# Patient Record
Sex: Male | Born: 1975
Health system: Southern US, Community
[De-identification: ages and names within clinical notes are randomized; demographics above are authoritative.]

## PROBLEM LIST (undated history)

## (undated) HISTORY — PX: OTHER SURGICAL HISTORY: SHX169

## (undated) HISTORY — PX: VASECTOMY: SHX75

---

## 2012-12-19 ENCOUNTER — Inpatient Hospital Stay (HOSPITAL_COMMUNITY)
Admission: EM | Admit: 2012-12-19 | Discharge: 2012-12-23 | DRG: 219 | Disposition: A | Discharge status: Home / Self Care | Payer: BC Managed Care – PPO | Attending: Emergency Medicine | Admitting: Emergency Medicine

## 2012-12-19 ENCOUNTER — Inpatient Hospital Stay (HOSPITAL_COMMUNITY): Payer: BC Managed Care – PPO

## 2012-12-19 ENCOUNTER — Emergency Department (HOSPITAL_COMMUNITY): Payer: BC Managed Care – PPO

## 2012-12-19 ENCOUNTER — Encounter (HOSPITAL_COMMUNITY): Payer: Self-pay | Admitting: Anesthesiology

## 2012-12-19 ENCOUNTER — Encounter (HOSPITAL_COMMUNITY): Admission: EM | Disposition: A | Discharge status: Home / Self Care | Payer: Self-pay | Source: Home / Self Care

## 2012-12-19 ENCOUNTER — Emergency Department (HOSPITAL_COMMUNITY): Payer: BC Managed Care – PPO | Admitting: Anesthesiology

## 2012-12-19 ENCOUNTER — Encounter (HOSPITAL_COMMUNITY): Payer: Self-pay

## 2012-12-19 DIAGNOSIS — M542 Cervicalgia: Secondary | ICD-10-CM | POA: Diagnosis present

## 2012-12-19 DIAGNOSIS — M25529 Pain in unspecified elbow: Secondary | ICD-10-CM | POA: Diagnosis present

## 2012-12-19 DIAGNOSIS — R109 Unspecified abdominal pain: Secondary | ICD-10-CM

## 2012-12-19 DIAGNOSIS — M25519 Pain in unspecified shoulder: Secondary | ICD-10-CM | POA: Diagnosis present

## 2012-12-19 DIAGNOSIS — Y92410 Unspecified street and highway as the place of occurrence of the external cause: Secondary | ICD-10-CM

## 2012-12-19 DIAGNOSIS — S8253XB Displaced fracture of medial malleolus of unspecified tibia, initial encounter for open fracture type I or II: Secondary | ICD-10-CM

## 2012-12-19 DIAGNOSIS — S82209B Unspecified fracture of shaft of unspecified tibia, initial encounter for open fracture type I or II: Principal | ICD-10-CM | POA: Diagnosis present

## 2012-12-19 DIAGNOSIS — T07XXXA Unspecified multiple injuries, initial encounter: Secondary | ICD-10-CM | POA: Diagnosis present

## 2012-12-19 DIAGNOSIS — S82202C Unspecified fracture of shaft of left tibia, initial encounter for open fracture type IIIA, IIIB, or IIIC: Secondary | ICD-10-CM

## 2012-12-19 DIAGNOSIS — K5900 Constipation, unspecified: Secondary | ICD-10-CM | POA: Diagnosis not present

## 2012-12-19 DIAGNOSIS — Y998 Other external cause status: Secondary | ICD-10-CM

## 2012-12-19 DIAGNOSIS — IMO0002 Reserved for concepts with insufficient information to code with codable children: Secondary | ICD-10-CM | POA: Diagnosis present

## 2012-12-19 HISTORY — PX: TIBIA IM NAIL INSERTION: SHX2516

## 2012-12-19 LAB — COMPREHENSIVE METABOLIC PANEL
ALT: 18 U/L (ref 0–53)
AST: 21 U/L (ref 0–37)
Albumin: 4.2 g/dL (ref 3.5–5.2)
Alkaline Phosphatase: 42 U/L (ref 39–117)
CO2: 24 mEq/L (ref 19–32)
Chloride: 100 mEq/L (ref 96–112)
GFR calc non Af Amer: 70 mL/min — ABNORMAL LOW (ref >90)
Potassium: 3.5 mEq/L (ref 3.5–5.1)
Sodium: 139 mEq/L (ref 135–145)
Total Bilirubin: 0.5 mg/dL (ref 0.3–1.2)

## 2012-12-19 LAB — POCT I-STAT, CHEM 8
BUN: 15 mg/dL (ref 6–23)
Creatinine, Ser: 1.5 mg/dL — ABNORMAL HIGH (ref 0.50–1.35)
Glucose, Bld: 143 mg/dL — ABNORMAL HIGH (ref 70–99)
Hemoglobin: 15.6 g/dL (ref 13.0–17.0)
Potassium: 3.6 mEq/L (ref 3.5–5.1)
Sodium: 141 mEq/L (ref 135–145)

## 2012-12-19 LAB — CBC
MCH: 29.9 pg (ref 26.0–34.0)
MCHC: 35 g/dL (ref 30.0–36.0)
MCV: 85.4 fL (ref 78.0–100.0)
Platelets: 247 10*3/uL (ref 150–400)

## 2012-12-19 LAB — CDS SEROLOGY

## 2012-12-19 LAB — PROTIME-INR: Prothrombin Time: 12.7 seconds (ref 11.6–15.2)
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
## 2012-12-19 SURGERY — INSERTION, INTRAMEDULLARY ROD, TIBIA
Anesthesia: General | Site: Leg Lower | Laterality: Left | Wound class: Dirty or Infected

## 2012-12-19 MED ORDER — MORPHINE SULFATE 2 MG/ML IJ SOLN
1.0000 mg | INTRAMUSCULAR | Status: DC | PRN
  Administered 2012-12-19: 1 mg via INTRAVENOUS
  Filled 2012-12-19: qty 1

## 2012-12-19 MED ORDER — SODIUM CHLORIDE 0.9 % IV SOLN: INTRAVENOUS | Status: DC

## 2012-12-19 MED ORDER — POLYETHYLENE GLYCOL 3350 17 G PO PACK
17.0000 g | PACK | Freq: Every day | ORAL | Status: DC | PRN
  Administered 2012-12-21 – 2012-12-23 (×2): 17 g via ORAL
  Filled 2012-12-19 (×3): qty 1

## 2012-12-19 MED ORDER — IOHEXOL 300 MG/ML  SOLN
100.0000 mL | Freq: Once | INTRAMUSCULAR | Status: AC | PRN
  Administered 2012-12-19: 100 mL via INTRAVENOUS

## 2012-12-19 MED ORDER — ONDANSETRON HCL 4 MG/2ML IJ SOLN: 4.0000 mg | Freq: Four times a day (QID) | INTRAMUSCULAR | Status: DC | PRN

## 2012-12-19 MED ORDER — LACTATED RINGERS IV SOLN
INTRAVENOUS | Status: DC | PRN
  Administered 2012-12-19 (×2): via INTRAVENOUS

## 2012-12-19 MED ORDER — CEFAZOLIN SODIUM 1-5 GM-% IV SOLN
1.0000 g | Freq: Once | INTRAVENOUS | Status: AC
  Administered 2012-12-19: 1 g via INTRAVENOUS
  Filled 2012-12-19: qty 50

## 2012-12-19 MED ORDER — HYDROCODONE-ACETAMINOPHEN 5-325 MG PO TABS
1.0000 | ORAL_TABLET | ORAL | Status: DC | PRN
  Administered 2012-12-19 (×2): 1 via ORAL
  Administered 2012-12-20: 2 via ORAL
  Administered 2012-12-20: 1 via ORAL
  Administered 2012-12-21 – 2012-12-22 (×4): 2 via ORAL
  Administered 2012-12-22: 1 via ORAL
  Administered 2012-12-22 – 2012-12-23 (×5): 2 via ORAL
  Filled 2012-12-19 (×2): qty 2
  Filled 2012-12-19: qty 1
  Filled 2012-12-19 (×7): qty 2
  Filled 2012-12-19: qty 1
  Filled 2012-12-19 (×4): qty 2

## 2012-12-19 MED ORDER — DIPHENHYDRAMINE HCL 12.5 MG/5ML PO ELIX: 25.0000 mg | ORAL_SOLUTION | ORAL | Status: DC | PRN

## 2012-12-19 MED ORDER — DIAZEPAM 5 MG PO TABS
5.0000 mg | ORAL_TABLET | Freq: Four times a day (QID) | ORAL | Status: DC | PRN
  Administered 2012-12-19 – 2012-12-23 (×8): 5 mg via ORAL
  Filled 2012-12-19 (×8): qty 1

## 2012-12-19 MED ORDER — OXYCODONE HCL 5 MG/5ML PO SOLN: 5.0000 mg | Freq: Once | ORAL | Status: AC | PRN

## 2012-12-19 MED ORDER — ONDANSETRON HCL 4 MG/2ML IJ SOLN: 4.0000 mg | Freq: Once | INTRAMUSCULAR | Status: DC | PRN

## 2012-12-19 MED ORDER — METOCLOPRAMIDE HCL 5 MG/ML IJ SOLN: 5.0000 mg | Freq: Three times a day (TID) | INTRAMUSCULAR | Status: DC | PRN

## 2012-12-19 MED ORDER — SODIUM CHLORIDE 0.9 % IR SOLN
Status: DC | PRN
  Administered 2012-12-19: 6000 mL

## 2012-12-19 MED ORDER — SORBITOL 70 % SOLN: 30.0000 mL | Freq: Every day | Status: DC | PRN

## 2012-12-19 MED ORDER — HYDROMORPHONE HCL PF 1 MG/ML IJ SOLN
1.0000 mg | Freq: Once | INTRAMUSCULAR | Status: AC
  Administered 2012-12-19: 1 mg via INTRAVENOUS

## 2012-12-19 MED ORDER — CEFAZOLIN SODIUM-DEXTROSE 2-3 GM-% IV SOLR
2.0000 g | Freq: Four times a day (QID) | INTRAVENOUS | Status: AC
  Administered 2012-12-19 – 2012-12-20 (×3): 2 g via INTRAVENOUS
  Filled 2012-12-19 (×3): qty 50

## 2012-12-19 MED ORDER — ONDANSETRON HCL 4 MG/2ML IJ SOLN
INTRAMUSCULAR | Status: DC | PRN
  Administered 2012-12-19: 4 mg via INTRAVENOUS

## 2012-12-19 MED ORDER — TETANUS-DIPHTHERIA TOXOIDS TD 5-2 LFU IM INJ
0.5000 mL | INJECTION | Freq: Once | INTRAMUSCULAR | Status: AC
  Administered 2012-12-19: 0.5 mL via INTRAMUSCULAR
  Filled 2012-12-19: qty 0.5

## 2012-12-19 MED ORDER — CEFAZOLIN SODIUM-DEXTROSE 2-3 GM-% IV SOLR
INTRAVENOUS | Status: DC | PRN
  Administered 2012-12-19: 2 g via INTRAVENOUS

## 2012-12-19 MED ORDER — LORAZEPAM 2 MG/ML IJ SOLN
1.0000 mg | Freq: Once | INTRAMUSCULAR | Status: AC
  Administered 2012-12-19: 1 mg via INTRAVENOUS
  Filled 2012-12-19: qty 1

## 2012-12-19 MED ORDER — HYDROMORPHONE HCL PF 1 MG/ML IJ SOLN
INTRAMUSCULAR | Status: AC
  Administered 2012-12-19: 1 mg via INTRAVENOUS
  Filled 2012-12-19: qty 1

## 2012-12-19 MED ORDER — ASPIRIN EC 325 MG PO TBEC
325.0000 mg | DELAYED_RELEASE_TABLET | Freq: Two times a day (BID) | ORAL | Status: DC
  Administered 2012-12-19 – 2012-12-23 (×8): 325 mg via ORAL
  Filled 2012-12-19 (×10): qty 1

## 2012-12-19 MED ORDER — ONDANSETRON HCL 4 MG PO TABS: 4.0000 mg | ORAL_TABLET | Freq: Four times a day (QID) | ORAL | Status: DC | PRN

## 2012-12-19 MED ORDER — ROCURONIUM BROMIDE 100 MG/10ML IV SOLN
INTRAVENOUS | Status: DC | PRN
  Administered 2012-12-19: 50 mg via INTRAVENOUS
  Administered 2012-12-19 (×2): 10 mg via INTRAVENOUS
  Administered 2012-12-19: 30 mg via INTRAVENOUS

## 2012-12-19 MED ORDER — WHITE PETROLATUM GEL
Status: AC
  Filled 2012-12-19: qty 5

## 2012-12-19 MED ORDER — SODIUM CHLORIDE 0.9 % IV SOLN
INTRAVENOUS | Status: DC
  Administered 2012-12-19: 09:00:00 via INTRAVENOUS
  Administered 2012-12-19: 125 mL/h via INTRAVENOUS

## 2012-12-19 MED ORDER — ARTIFICIAL TEARS OP OINT
TOPICAL_OINTMENT | OPHTHALMIC | Status: DC | PRN
  Administered 2012-12-19: 1 via OPHTHALMIC

## 2012-12-19 MED ORDER — CEFAZOLIN SODIUM-DEXTROSE 2-3 GM-% IV SOLR
INTRAVENOUS | Status: AC
  Filled 2012-12-19: qty 50

## 2012-12-19 MED ORDER — HYDROMORPHONE HCL PF 1 MG/ML IJ SOLN: 0.2500 mg | INTRAMUSCULAR | Status: DC | PRN

## 2012-12-19 MED ORDER — NEOSTIGMINE METHYLSULFATE 1 MG/ML IJ SOLN
INTRAMUSCULAR | Status: DC | PRN
  Administered 2012-12-19: 4 mg via INTRAVENOUS

## 2012-12-19 MED ORDER — ONDANSETRON HCL 4 MG/2ML IJ SOLN
4.0000 mg | Freq: Once | INTRAMUSCULAR | Status: AC
  Administered 2012-12-19: 4 mg via INTRAVENOUS
  Filled 2012-12-19: qty 2

## 2012-12-19 MED ORDER — OXYCODONE HCL 5 MG PO TABS
5.0000 mg | ORAL_TABLET | Freq: Once | ORAL | Status: AC | PRN
  Administered 2012-12-19: 5 mg via ORAL

## 2012-12-19 MED ORDER — HYDROMORPHONE HCL PF 1 MG/ML IJ SOLN
1.0000 mg | Freq: Once | INTRAMUSCULAR | Status: AC
  Administered 2012-12-19: 1 mg via INTRAVENOUS
  Filled 2012-12-19: qty 1

## 2012-12-19 MED ORDER — OXYCODONE HCL 5 MG PO TABS
ORAL_TABLET | ORAL | Status: AC
  Filled 2012-12-19: qty 1

## 2012-12-19 MED ORDER — MAGNESIUM CITRATE PO SOLN
1.0000 | Freq: Once | ORAL | Status: AC | PRN
  Filled 2012-12-19: qty 296

## 2012-12-19 MED ORDER — MORPHINE SULFATE 2 MG/ML IJ SOLN
2.0000 mg | INTRAMUSCULAR | Status: DC | PRN
  Administered 2012-12-19 – 2012-12-20 (×10): 2 mg via INTRAVENOUS
  Filled 2012-12-19 (×9): qty 1

## 2012-12-19 MED ORDER — GENTAMICIN IN SALINE 1.6-0.9 MG/ML-% IV SOLN
80.0000 mg | Freq: Once | INTRAVENOUS | Status: AC
  Administered 2012-12-19: 80 mg via INTRAVENOUS
  Filled 2012-12-19: qty 50

## 2012-12-19 MED ORDER — VECURONIUM BROMIDE 10 MG IV SOLR
INTRAVENOUS | Status: DC | PRN
  Administered 2012-12-19: 2 mg via INTRAVENOUS

## 2012-12-19 MED ORDER — HYDROMORPHONE HCL PF 1 MG/ML IJ SOLN
INTRAMUSCULAR | Status: AC
  Filled 2012-12-19: qty 1

## 2012-12-19 MED ORDER — HYDROMORPHONE HCL PF 1 MG/ML IJ SOLN: 1.0000 mg | Freq: Once | INTRAMUSCULAR | Status: DC

## 2012-12-19 MED ORDER — MIDAZOLAM HCL 5 MG/5ML IJ SOLN
INTRAMUSCULAR | Status: DC | PRN
  Administered 2012-12-19: 2 mg via INTRAVENOUS

## 2012-12-19 MED ORDER — GLYCOPYRROLATE 0.2 MG/ML IJ SOLN
INTRAMUSCULAR | Status: DC | PRN
  Administered 2012-12-19: 0.6 mg via INTRAVENOUS

## 2012-12-19 MED ORDER — PROPOFOL 10 MG/ML IV BOLUS
INTRAVENOUS | Status: DC | PRN
  Administered 2012-12-19: 200 mg via INTRAVENOUS

## 2012-12-19 MED ORDER — FENTANYL CITRATE 0.05 MG/ML IJ SOLN
INTRAMUSCULAR | Status: AC
  Filled 2012-12-19: qty 2

## 2012-12-19 MED ORDER — SODIUM CHLORIDE 0.9 % IV BOLUS (SEPSIS): 1000.0000 mL | Freq: Once | INTRAVENOUS | Status: DC

## 2012-12-19 MED ORDER — MORPHINE SULFATE 2 MG/ML IJ SOLN
INTRAMUSCULAR | Status: AC
  Filled 2012-12-19: qty 1

## 2012-12-19 MED ORDER — LACTATED RINGERS IV SOLN
INTRAVENOUS | Status: DC | PRN
  Administered 2012-12-19 (×2): via INTRAVENOUS

## 2012-12-19 MED ORDER — HYDROMORPHONE HCL PF 1 MG/ML IJ SOLN
0.2500 mg | INTRAMUSCULAR | Status: DC | PRN
  Administered 2012-12-19 (×4): 0.5 mg via INTRAVENOUS

## 2012-12-19 MED ORDER — FENTANYL CITRATE 0.05 MG/ML IJ SOLN
50.0000 ug | INTRAMUSCULAR | Status: DC | PRN
  Administered 2012-12-19: 100 ug via INTRAVENOUS

## 2012-12-19 MED ORDER — LIDOCAINE HCL (CARDIAC) 20 MG/ML IV SOLN
INTRAVENOUS | Status: DC | PRN
  Administered 2012-12-19: 100 mg via INTRAVENOUS

## 2012-12-19 MED ORDER — SENNA 8.6 MG PO TABS
1.0000 | ORAL_TABLET | Freq: Two times a day (BID) | ORAL | Status: DC
  Administered 2012-12-19 – 2012-12-23 (×8): 8.6 mg via ORAL
  Filled 2012-12-19 (×9): qty 1

## 2012-12-19 MED ORDER — FENTANYL CITRATE 0.05 MG/ML IJ SOLN
50.0000 ug | INTRAMUSCULAR | Status: DC | PRN
  Administered 2012-12-19: 50 ug via INTRAVENOUS
  Filled 2012-12-19: qty 2

## 2012-12-19 MED ORDER — FENTANYL CITRATE 0.05 MG/ML IJ SOLN
INTRAMUSCULAR | Status: DC | PRN
  Administered 2012-12-19 (×3): 50 ug via INTRAVENOUS
  Administered 2012-12-19: 100 ug via INTRAVENOUS

## 2012-12-19 MED ORDER — OXYCODONE HCL 5 MG PO TABS
5.0000 mg | ORAL_TABLET | ORAL | Status: DC | PRN
  Administered 2012-12-20 (×5): 10 mg via ORAL
  Administered 2012-12-21: 5 mg via ORAL
  Administered 2012-12-21 (×3): 10 mg via ORAL
  Administered 2012-12-21: 5 mg via ORAL
  Administered 2012-12-21 – 2012-12-22 (×4): 10 mg via ORAL
  Administered 2012-12-22: 5 mg via ORAL
  Administered 2012-12-22 – 2012-12-23 (×8): 10 mg via ORAL
  Filled 2012-12-19: qty 2
  Filled 2012-12-19: qty 1
  Filled 2012-12-19 (×19): qty 2
  Filled 2012-12-19: qty 1
  Filled 2012-12-19: qty 2

## 2012-12-19 MED ORDER — METOCLOPRAMIDE HCL 10 MG PO TABS: 5.0000 mg | ORAL_TABLET | Freq: Three times a day (TID) | ORAL | Status: DC | PRN

## 2012-12-19 SURGICAL SUPPLY — 68 items
BANDAGE ELASTIC 4 VELCRO ST LF (GAUZE/BANDAGES/DRESSINGS) ×2 IMPLANT
BANDAGE ELASTIC 6 VELCRO ST LF (GAUZE/BANDAGES/DRESSINGS) ×2 IMPLANT
BANDAGE GAUZE ELAST BULKY 4 IN (GAUZE/BANDAGES/DRESSINGS) ×2 IMPLANT
BIT DRILL LONG 4.0 (BIT) ×1 IMPLANT
BIT DRILL SHORT 4.0 (BIT) ×2 IMPLANT
BLADE SURG 10 STRL SS (BLADE) ×2 IMPLANT
BNDG COHESIVE 4X5 TAN STRL (GAUZE/BANDAGES/DRESSINGS) ×2 IMPLANT
BNDG COHESIVE 6X5 TAN STRL LF (GAUZE/BANDAGES/DRESSINGS) ×2 IMPLANT
CLOTH BEACON ORANGE TIMEOUT ST (SAFETY) ×2 IMPLANT
COVER MAYO STAND STRL (DRAPES) ×2 IMPLANT
COVER SURGICAL LIGHT HANDLE (MISCELLANEOUS) ×4 IMPLANT
CUFF TOURNIQUET SINGLE 34IN LL (TOURNIQUET CUFF) IMPLANT
CUFF TOURNIQUET SINGLE 44IN (TOURNIQUET CUFF) IMPLANT
DRAPE C-ARM 42X72 X-RAY (DRAPES) ×2 IMPLANT
DRAPE C-ARMOR (DRAPES) ×2 IMPLANT
DRAPE INCISE IOBAN 66X45 STRL (DRAPES) IMPLANT
DRAPE ORTHO SPLIT 77X108 STRL (DRAPES) ×1
DRAPE PROXIMA HALF (DRAPES) ×2 IMPLANT
DRAPE SURG ORHT 6 SPLT 77X108 (DRAPES) ×1 IMPLANT
DRESSING OPSITE X SMALL 2X3 (GAUZE/BANDAGES/DRESSINGS) ×2 IMPLANT
DRILL BIT LONG 4.0 (BIT) ×2
DRILL BIT SHORT 4.0 (BIT) ×2
DURAPREP 26ML APPLICATOR (WOUND CARE) ×2 IMPLANT
ELECT REM PT RETURN 9FT ADLT (ELECTROSURGICAL) ×2
ELECTRODE REM PT RTRN 9FT ADLT (ELECTROSURGICAL) ×1 IMPLANT
GAUZE XEROFORM 1X8 LF (GAUZE/BANDAGES/DRESSINGS) ×2 IMPLANT
GAUZE XEROFORM 5X9 LF (GAUZE/BANDAGES/DRESSINGS) ×4 IMPLANT
GLOVE BIO SURGEON STRL SZ8 (GLOVE) ×2 IMPLANT
GLOVE BIOGEL PI IND STRL 8 (GLOVE) ×2 IMPLANT
GLOVE BIOGEL PI INDICATOR 8 (GLOVE) ×2
GLOVE ORTHO TXT STRL SZ7.5 (GLOVE) ×2 IMPLANT
GOWN PREVENTION PLUS LG XLONG (DISPOSABLE) IMPLANT
GOWN PREVENTION PLUS XLARGE (GOWN DISPOSABLE) ×4 IMPLANT
GOWN STRL NON-REIN LRG LVL3 (GOWN DISPOSABLE) ×2 IMPLANT
GUIDE PIN 3.2MM (MISCELLANEOUS) ×1
GUIDE PIN ORTH 343X3.2XBRAD (MISCELLANEOUS) ×1 IMPLANT
GUIDE ROD 3.0 (MISCELLANEOUS) ×2
KIT BASIN OR (CUSTOM PROCEDURE TRAY) ×2 IMPLANT
KIT ROOM TURNOVER OR (KITS) ×2 IMPLANT
MANIFOLD NEPTUNE II (INSTRUMENTS) ×2 IMPLANT
NAIL TIBIAL10X35 (Nail) ×2 IMPLANT
NEEDLE HYPO 21X1.5 SAFETY (NEEDLE) ×2 IMPLANT
NS IRRIG 1000ML POUR BTL (IV SOLUTION) ×2 IMPLANT
PACK ORTHO EXTREMITY (CUSTOM PROCEDURE TRAY) ×2 IMPLANT
PAD ARMBOARD 7.5X6 YLW CONV (MISCELLANEOUS) ×4 IMPLANT
PAD CAST 4YDX4 CTTN HI CHSV (CAST SUPPLIES) ×1 IMPLANT
PADDING CAST COTTON 4X4 STRL (CAST SUPPLIES) ×1
ROD GUIDE 3.0 (MISCELLANEOUS) ×1 IMPLANT
SCREW TRIGEN LOW PROF 5.0X30 (Screw) ×2 IMPLANT
SCREW TRIGEN LOW PROF 5.0X37.5 (Screw) ×4 IMPLANT
SCREW TRIGEN LOW PROF 5.0X45 (Screw) ×2 IMPLANT
SPONGE GAUZE 4X4 12PLY (GAUZE/BANDAGES/DRESSINGS) ×2 IMPLANT
SPONGE LAP 18X18 X RAY DECT (DISPOSABLE) ×2 IMPLANT
SPONGE LAP 4X18 X RAY DECT (DISPOSABLE) ×2 IMPLANT
STAPLER VISISTAT 35W (STAPLE) ×2 IMPLANT
STOCKINETTE IMPERVIOUS LG (DRAPES) ×2 IMPLANT
SUT ETHILON 2 0 FS 18 (SUTURE) ×2 IMPLANT
SUT VIC AB 0 CT1 27 (SUTURE) ×1
SUT VIC AB 0 CT1 27XBRD ANBCTR (SUTURE) ×1 IMPLANT
SUT VIC AB 0 CTB1 27 (SUTURE) ×2 IMPLANT
SUT VIC AB 2-0 CT1 27 (SUTURE) ×1
SUT VIC AB 2-0 CT1 TAPERPNT 27 (SUTURE) ×1 IMPLANT
SYR CONTROL 10ML LL (SYRINGE) ×2 IMPLANT
TOWEL OR 17X24 6PK STRL BLUE (TOWEL DISPOSABLE) ×2 IMPLANT
TOWEL OR 17X26 10 PK STRL BLUE (TOWEL DISPOSABLE) ×2 IMPLANT
TUBE CONNECTING 12X1/4 (SUCTIONS) ×2 IMPLANT
WATER STERILE IRR 1000ML POUR (IV SOLUTION) ×2 IMPLANT
YANKAUER SUCT BULB TIP NO VENT (SUCTIONS) ×2 IMPLANT

## 2012-12-19 NOTE — Anesthesia Preprocedure Evaluation (Addendum)
Anesthesia Evaluation  Patient identified by MRN, date of birth, ID band Patient awake    Reviewed: Allergy & Precautions, H&P , NPO status , Patient's Chart, lab work & pertinent test results  Airway Mallampati: II  Neck ROM: full    Dental   Pulmonary neg pulmonary ROS,          Cardiovascular negative cardio ROS      Neuro/Psych negative neurological ROS     GI/Hepatic   Endo/Other    Renal/GU      Musculoskeletal   Abdominal   Peds  Hematology   Anesthesia Other Findings   Reproductive/Obstetrics                           Anesthesia Physical Anesthesia Plan  ASA: I  Anesthesia Plan: General   Post-op Pain Management:    Induction: Intravenous  Airway Management Planned: Oral ETT  Additional Equipment:   Intra-op Plan:   Post-operative Plan: Extubation in OR  Informed Consent: I have reviewed the patients History and Physical, chart, labs and discussed the procedure including the risks, benefits and alternatives for the proposed anesthesia with the patient or authorized representative who has indicated his/her understanding and acceptance.     Plan Discussed with: CRNA, Anesthesiologist and Surgeon  Anesthesia Plan Comments:         Anesthesia Quick Evaluation
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
## 2012-12-19 NOTE — ED Notes (Signed)
Lactic acid and chem 8 results called to primary nurse in CT

## 2012-12-19 NOTE — Preoperative (Signed)
Beta Blockers   Reason not to administer Beta Blockers:Not Applicable 

## 2012-12-19 NOTE — Brief Op Note (Signed)
Brief Op Note  Date of Surgery: 12/19/2012  Preoperative Diagnosis: Left open tibia fracture  Postoperative Diagnosis: same  Procedure: Procedure(s): INTRAMEDULLARY (IM) NAIL TIBIAL Left  Implants: S&N tibial nail  Surgeons: Surgeon(s): Naiping Glee Arvin, MD Kathryne Hitch, MD  Anesthesia: General  Drains: none  Estimated Blood Loss: * No blood loss amount entered *  Complications: None  Condition to PACU: Stable  Naiping Glee Arvin, MD [facility] 12/19/2012 2:26 PM
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
## 2012-12-19 NOTE — ED Notes (Signed)
Pt remains in Radiology. Family updated on pt's status.

## 2012-12-19 NOTE — Transfer of Care (Signed)
Immediate Anesthesia Transfer of Care Note  Patient: Benjamin Morgan  Procedure(s) Performed: Procedure(s): INTRAMEDULLARY (IM) NAIL TIBIAL Left (Left)  Patient Location: PACU  Anesthesia Type:General  Level of Consciousness: awake, alert , oriented and sedated  Airway & Oxygen Therapy: Patient Spontanous Breathing and Patient connected to nasal cannula oxygen  Post-op Assessment: Report given to PACU RN, Post -op Vital signs reviewed and stable and Patient moving all extremities  Post vital signs: Reviewed and stable  Complications: No apparent anesthesia complications
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
## 2012-12-19 NOTE — Anesthesia Postprocedure Evaluation (Signed)
  Anesthesia Post-op Note  Patient: Benjamin Morgan  Procedure(s) Performed: Procedure(s): INTRAMEDULLARY (IM) NAIL TIBIAL Left (Left)  Patient Location: PACU  Anesthesia Type:General  Level of Consciousness: awake  Airway and Oxygen Therapy: Patient Spontanous Breathing  Post-op Pain: mild  Post-op Assessment: Post-op Vital signs reviewed  Post-op Vital Signs: stable  Complications: No apparent anesthesia complications

## 2012-12-19 NOTE — ED Notes (Signed)
Dr Wyatt at bedside.  

## 2012-12-19 NOTE — Progress Notes (Signed)
Orthopedic Tech Progress Note Patient Details:  Benjamin Morgan [birth year] [id number]  Ortho Devices Type of Ortho Device: Post (short leg) splint;Stirrup splint Ortho Device/Splint Interventions: Application   Cammer, Mickie Bail 12/19/2012, 11:04 AM
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
## 2012-12-19 NOTE — ED Notes (Signed)
Patient transported to CT 
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
## 2012-12-20 ENCOUNTER — Encounter (HOSPITAL_COMMUNITY): Payer: Self-pay | Admitting: Emergency Medicine

## 2012-12-20 ENCOUNTER — Inpatient Hospital Stay (HOSPITAL_COMMUNITY): Payer: BC Managed Care – PPO

## 2012-12-20 LAB — COMPREHENSIVE METABOLIC PANEL
ALT: 15 U/L (ref 0–53)
AST: 33 U/L (ref 0–37)
Albumin: 3.4 g/dL — ABNORMAL LOW (ref 3.5–5.2)
Alkaline Phosphatase: 38 U/L — ABNORMAL LOW (ref 39–117)
Calcium: 8.7 mg/dL (ref 8.4–10.5)
GFR calc Af Amer: 85 mL/min — ABNORMAL LOW (ref >90)
Potassium: 3.8 mEq/L (ref 3.5–5.1)
Sodium: 138 mEq/L (ref 135–145)
Total Protein: 6.2 g/dL (ref 6.0–8.3)

## 2012-12-20 MED ORDER — PREGABALIN 50 MG PO CAPS
75.0000 mg | ORAL_CAPSULE | Freq: Two times a day (BID) | ORAL | Status: DC
  Administered 2012-12-20 – 2012-12-23 (×7): 75 mg via ORAL
  Filled 2012-12-20 (×7): qty 1

## 2012-12-20 MED ORDER — ACETAMINOPHEN 500 MG PO TABS: 500.0000 mg | ORAL_TABLET | Freq: Four times a day (QID) | ORAL | Status: DC | PRN

## 2012-12-20 NOTE — Progress Notes (Signed)
Subjective:  Patient reports pain as moderate to severe.  No events  Objective:   VITALS:   Filed Vitals:   12/19/12 1844 12/19/12 2105 12/20/12 0206 12/20/12 0503  BP: 134/83 135/82 133/79 130/71  Pulse: 90 83 69 69  Temp: 98.8 F (37.1 C) 98.9 F (37.2 C) 98.8 F (37.1 C) 98.8 F (37.1 C)  TempSrc: Oral     Resp: 16 16 16 16   Height:      Weight:      SpO2: 100% 99% 99% 100%    Neurologically intact Neurovascular intact Sensation intact distally Intact pulses distally Incision: scant drainage No cellulitis present Compartment soft   Lab Results  Component Value Date   WBC 11.5* 12/19/2012   HGB 15.6 12/19/2012   HCT 46.0 12/19/2012   MCV 85.4 12/19/2012   PLT 247 12/19/2012     Assessment/Plan: 1 Day Post-Op   Problem List Items Addressed This Visit   None      Advance diet Up with therapy Dc foley this am DVT ppx - asa bid Xray of left shoulder ordered - shoulder pain likely 2/2 AC sprain Pain control Discharge planning   Cheral Almas 12/20/2012, 8:10 AM [phone]

## 2012-12-20 NOTE — Progress Notes (Signed)
Pt only able to void 50cc after foley D/Cd at 0700 this am. Bladder scan showed 924 cc urine. I/O cath done at this time. 1100 cc urine obtained. Will continue to monitor pts output.

## 2012-12-20 NOTE — Progress Notes (Signed)
Rehab Admissions Coordinator Note:  Patient was screened by Trish Mage for appropriateness for an Inpatient Acute Rehab Consult.  Noted PT/OT recommending CIR.  At this time, we are recommending Inpatient Rehab consult.  Trish Mage 12/20/2012, 4:30 PM  I can be reached at [phone].
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
## 2012-12-20 NOTE — Progress Notes (Signed)
UR COMPLETED  
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
## 2012-12-20 NOTE — Progress Notes (Signed)
Pt very hesitant to move or be moved at all in bed,tenses up,prns given w relief for 2hrs,family at bedside hand feeding pt.Enc to maintain some independent adl's and move unaffected extremities.c/o r shoulder pain upper frontal area right side.3+ bil radial/brachial pulses.Linward Headland D

## 2012-12-20 NOTE — Progress Notes (Signed)
Orthopedic Tech Progress Note Patient Details:  Benjamin Morgan 07-28-1975 [id number]  Ortho Devices Type of Ortho Device: Post (short leg) splint;Stirrup splint Ortho Device/Splint Location: applied overhead frame to bed Ortho Device/Splint Interventions: Ordered;Application   [name] 12/20/2012, 8:16 PM

## 2012-12-21 ENCOUNTER — Encounter (HOSPITAL_COMMUNITY): Payer: Self-pay | Admitting: Orthopaedic Surgery

## 2012-12-21 DIAGNOSIS — M7530 Calcific tendinitis of unspecified shoulder: Secondary | ICD-10-CM

## 2012-12-21 DIAGNOSIS — S82409A Unspecified fracture of shaft of unspecified fibula, initial encounter for closed fracture: Secondary | ICD-10-CM

## 2012-12-21 DIAGNOSIS — S82209A Unspecified fracture of shaft of unspecified tibia, initial encounter for closed fracture: Secondary | ICD-10-CM

## 2012-12-21 MED ORDER — DIAZEPAM 5 MG PO TABS: 5.0000 mg | ORAL_TABLET | Freq: Four times a day (QID) | ORAL | Status: DC | PRN

## 2012-12-21 MED ORDER — ASPIRIN 325 MG PO TBEC: 325.0000 mg | DELAYED_RELEASE_TABLET | Freq: Two times a day (BID) | ORAL | Status: DC

## 2012-12-21 MED ORDER — SENNA 8.6 MG PO TABS: 1.0000 | ORAL_TABLET | Freq: Two times a day (BID) | ORAL | Status: DC

## 2012-12-21 MED ORDER — ONDANSETRON HCL 4 MG PO TABS: 4.0000 mg | ORAL_TABLET | Freq: Four times a day (QID) | ORAL | Status: DC | PRN

## 2012-12-21 MED ORDER — PREGABALIN 75 MG PO CAPS: 75.0000 mg | ORAL_CAPSULE | Freq: Two times a day (BID) | ORAL | Status: DC

## 2012-12-21 MED ORDER — OXYCODONE HCL 5 MG PO TABS: 5.0000 mg | ORAL_TABLET | ORAL | Status: DC | PRN
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
## 2012-12-21 NOTE — Progress Notes (Signed)
Looks like pt has progressed well with therapy as to go home as he prefers. I will follow up on Monday. [phone]

## 2012-12-21 NOTE — Progress Notes (Signed)
Subjective:  Patient reports pain as moderate to severe.  No events  Objective:   VITALS:   Filed Vitals:   12/20/12 2056 12/20/12 2357 12/21/12 0504 12/21/12 1300  BP: 141/74  139/83 145/84  Pulse: 106  87 107  Temp: 100.9 F (38.3 C) 99.2 F (37.3 C) 99 F (37.2 C) 98.7 F (37.1 C)  TempSrc: Oral Oral Oral Oral  Resp: 16  16 16   Height:      Weight:      SpO2: 100%  98% 96%    Neurologically intact Neurovascular intact Sensation intact distally Intact pulses distally Incision: scant drainage No cellulitis present Compartment soft   Lab Results  Component Value Date   WBC 11.5* 12/19/2012   HGB 15.6 12/19/2012   HCT 46.0 12/19/2012   MCV 85.4 12/19/2012   PLT 247 12/19/2012     Assessment/Plan: 2 Days Post-Op   Problem List Items Addressed This Visit   None      Advance diet Up with therapy - did much better today and should be able to dc home with 24h assistance DVT ppx - asa bid Xrays of right shoulder are unremarkable - likely contusion or AC sprain Pain control Discharge planning Short leg splint placed today Rx in chart   Cheral Almas 12/21/2012, 6:14 PM [phone]
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
## 2012-12-21 NOTE — Progress Notes (Signed)
Met with patient regarding choice for home health services, provided a list of agencies for [facility]. Explained NCM will contact the home health agency he selects to come to his home for PT and OT visits.  Patient stated he was not able to make any decisions today.   NCM will follow up with patient regarding home health agency selection and DME needs.

## 2012-12-21 NOTE — Progress Notes (Signed)
Orthopedic Tech Progress Note Patient Details:  Benjamin Morgan 05-25-1975 [id number]  Ortho Devices Type of Ortho Device: Ace wrap;Post (short leg) splint;Stirrup splint Ortho Device/Splint Location: LLE Ortho Device/Splint Interventions: Ordered;Application   [name] 12/21/2012, 5:56 PM
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
## 2012-12-22 NOTE — Progress Notes (Signed)
Clinical Child psychotherapist (CSW) received referral for SNF. CSW completed chart review and noted plans for D/C home per PT note and CIR case manager note. CSW singing off. Please reconsult if social work needs arise.   Jetta Lout, LCSWA Weekend CSW [phone]
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
## 2012-12-23 NOTE — Progress Notes (Signed)
Subjective:  Patient reports pain as mild.  Improved.   Objective:   VITALS:   Filed Vitals:   12/22/12 0647 12/22/12 1227 12/22/12 2224 12/23/12 0551  BP: 122/70 149/73 154/80 142/76  Pulse: 80 94 94 86  Temp: 98.3 F (36.8 C)  99.2 F (37.3 C) 98.3 F (36.8 C)  TempSrc: Oral  Oral Oral  Resp: 18 18 16 19   Height:      Weight:      SpO2: 96% 96% 95% 99%    Neurologically intact Neurovascular intact Sensation intact distally Intact pulses distally Incision: dressing C/D/I and no drainage No cellulitis present Compartment soft   Lab Results  Component Value Date   WBC 11.5* 12/19/2012   HGB 15.6 12/19/2012   HCT 46.0 12/19/2012   MCV 85.4 12/19/2012   PLT 247 12/19/2012     Assessment/Plan: 4 Days Post-Op   Problem List Items Addressed This Visit   Left tibial fracture - Primary   Relevant Orders      Call MD / Call 911      Diet - low sodium heart healthy      Constipation Prevention      Increase activity slowly as tolerated      Non weight bearing      Driving restrictions      Call MD / Call 911      Diet - low sodium heart healthy      Constipation Prevention      Increase activity slowly as tolerated      Driving restrictions      Up with therapy D/C IV fluids DVT ppx - asa 325 bid Pain control Discharge planning - home today   Cheral Almas 12/23/2012, 4:53 PM [phone]

## 2013-01-09 ENCOUNTER — Other Ambulatory Visit: Payer: Self-pay | Admitting: Orthopaedic Surgery

## 2013-01-09 DIAGNOSIS — M25511 Pain in right shoulder: Secondary | ICD-10-CM

## 2013-01-17 ENCOUNTER — Ambulatory Visit
Admission: RE | Admit: 2013-01-17 | Discharge: 2013-01-17 | Disposition: A | Discharge status: Home / Self Care | Payer: BC Managed Care – PPO | Source: Ambulatory Visit | Attending: Orthopaedic Surgery | Admitting: Orthopaedic Surgery

## 2013-01-17 DIAGNOSIS — M25511 Pain in right shoulder: Secondary | ICD-10-CM

## 2013-03-20 ENCOUNTER — Other Ambulatory Visit: Payer: Self-pay | Admitting: Orthopaedic Surgery

## 2013-03-20 DIAGNOSIS — M25532 Pain in left wrist: Secondary | ICD-10-CM

## 2013-03-23 ENCOUNTER — Ambulatory Visit
Admission: RE | Admit: 2013-03-23 | Discharge: 2013-03-23 | Disposition: A | Discharge status: Home / Self Care | Payer: BC Managed Care – PPO | Source: Ambulatory Visit | Attending: Orthopaedic Surgery | Admitting: Orthopaedic Surgery

## 2013-03-23 DIAGNOSIS — M25532 Pain in left wrist: Secondary | ICD-10-CM

## 2013-03-26 ENCOUNTER — Other Ambulatory Visit: Payer: BC Managed Care – PPO

## 2016-10-08 ENCOUNTER — Other Ambulatory Visit: Payer: Self-pay | Admitting: Orthopaedic Surgery

## 2016-10-08 MED ORDER — DICLOFENAC SODIUM 75 MG PO TBEC: 75.0000 mg | DELAYED_RELEASE_TABLET | Freq: Two times a day (BID) | ORAL | 2 refills | Status: DC

## 2016-10-10 ENCOUNTER — Other Ambulatory Visit (INDEPENDENT_AMBULATORY_CARE_PROVIDER_SITE_OTHER): Payer: Self-pay | Admitting: Orthopaedic Surgery

## 2016-10-10 MED ORDER — DICLOFENAC SODIUM 75 MG PO TBEC: 75.0000 mg | DELAYED_RELEASE_TABLET | Freq: Two times a day (BID) | ORAL | 2 refills | Status: DC

## 2016-12-07 ENCOUNTER — Ambulatory Visit (INDEPENDENT_AMBULATORY_CARE_PROVIDER_SITE_OTHER): Payer: 59 | Admitting: Family Medicine

## 2016-12-07 ENCOUNTER — Encounter: Payer: Self-pay | Admitting: Family Medicine

## 2016-12-07 VITALS — BP 124/84 | HR 55 | Temp 98.2°F | Ht 70.0 in | Wt 196.0 lb

## 2016-12-07 DIAGNOSIS — Z0000 Encounter for general adult medical examination without abnormal findings: Secondary | ICD-10-CM | POA: Diagnosis not present

## 2016-12-07 LAB — CBC WITH DIFFERENTIAL/PLATELET
BASOS ABS: 0 10*3/uL (ref 0.0–0.1)
Basophils Relative: 0.6 % (ref 0.0–3.0)
EOS PCT: 2 % (ref 0.0–5.0)
Eosinophils Absolute: 0.1 10*3/uL (ref 0.0–0.7)
HCT: 44.9 % (ref 39.0–52.0)
HEMOGLOBIN: 15 g/dL (ref 13.0–17.0)
LYMPHS ABS: 1.5 10*3/uL (ref 0.7–4.0)
Lymphocytes Relative: 30 % (ref 12.0–46.0)
MCHC: 33.5 g/dL (ref 30.0–36.0)
MCV: 90.2 fl (ref 78.0–100.0)
MONO ABS: 0.6 10*3/uL (ref 0.1–1.0)
MONOS PCT: 11.3 % (ref 3.0–12.0)
Neutro Abs: 2.9 10*3/uL (ref 1.4–7.7)
Neutrophils Relative %: 56.1 % (ref 43.0–77.0)
Platelets: 278 10*3/uL (ref 150.0–400.0)
RBC: 4.98 Mil/uL (ref 4.22–5.81)
RDW: 12.8 % (ref 11.5–15.5)
WBC: 5.1 10*3/uL (ref 4.0–10.5)

## 2016-12-07 LAB — HEPATIC FUNCTION PANEL
ALT: 30 U/L (ref 0–53)
AST: 25 U/L (ref 0–37)
Albumin: 4.9 g/dL (ref 3.5–5.2)
Alkaline Phosphatase: 43 U/L (ref 39–117)
BILIRUBIN DIRECT: 0.1 mg/dL (ref 0.0–0.3)
BILIRUBIN TOTAL: 0.5 mg/dL (ref 0.2–1.2)
Total Protein: 7.4 g/dL (ref 6.0–8.3)

## 2016-12-07 LAB — POC URINALSYSI DIPSTICK (AUTOMATED)
Bilirubin, UA: NEGATIVE
Blood, UA: NEGATIVE
CLARITY UA: NEGATIVE
Glucose, UA: NEGATIVE
Ketones, UA: NEGATIVE
LEUKOCYTES UA: NEGATIVE
Nitrite, UA: NEGATIVE
PH UA: 6 (ref 5.0–8.0)
PROTEIN UA: NEGATIVE
SPEC GRAV UA: 1.02 (ref 1.010–1.025)
UROBILINOGEN UA: 0.2 U/dL

## 2016-12-07 LAB — LIPID PANEL
CHOL/HDL RATIO: 4
Cholesterol: 236 mg/dL — ABNORMAL HIGH (ref 0–200)
HDL: 56 mg/dL (ref >39.00)
LDL Cholesterol: 156 mg/dL — ABNORMAL HIGH (ref 0–99)
NONHDL: 179.5
Triglycerides: 118 mg/dL (ref 0.0–149.0)
VLDL: 23.6 mg/dL (ref 0.0–40.0)

## 2016-12-07 LAB — BASIC METABOLIC PANEL
BUN: 14 mg/dL (ref 6–23)
CO2: 33 mEq/L — ABNORMAL HIGH (ref 19–32)
CREATININE: 1.16 mg/dL (ref 0.40–1.50)
Calcium: 10 mg/dL (ref 8.4–10.5)
Chloride: 99 mEq/L (ref 96–112)
GFR: 72.99 mL/min (ref >60.00)
Glucose, Bld: 95 mg/dL (ref 70–99)
Potassium: 4.5 mEq/L (ref 3.5–5.1)
Sodium: 138 mEq/L (ref 135–145)

## 2016-12-07 LAB — TSH: TSH: 1.53 u[IU]/mL (ref 0.35–4.50)

## 2016-12-07 NOTE — Patient Instructions (Signed)
WE NOW OFFER   [facility] FAST TRACK!!!  SAME DAY Appointments for ACUTE CARE  Such as: Sprains, Injuries, cuts, abrasions, rashes, muscle pain, joint pain, back pain Colds, flu, sore throats, headache, allergies, cough, fever  Ear pain, sinus and eye infections Abdominal pain, nausea, vomiting, diarrhea, upset stomach Animal/insect bites  3 Easy Ways to Schedule: Walk-In Scheduling Call in scheduling Mychart Sign-up: [email or web address]         

## 2016-12-07 NOTE — Progress Notes (Signed)
   Subjective:    Patient ID: Benjamin Morgan, male    DOB: [birth year], 41 y.o.   MRN: [id number]  HPI 41 yr old male to establish with us and for a well exam. He feels good and has no concerns.    Review of Systems  Constitutional: Negative.   HENT: Negative.   Eyes: Negative.   Respiratory: Negative.   Cardiovascular: Negative.   Gastrointestinal: Negative.   Genitourinary: Negative.   Musculoskeletal: Negative.   Skin: Negative.   Neurological: Negative.   Psychiatric/Behavioral: Negative.        Objective:   Physical Exam  Constitutional: He is oriented to person, place, and time. He appears well-developed and well-nourished. No distress.  HENT:  Head: Normocephalic and atraumatic.  Right Ear: External ear normal.  Left Ear: External ear normal.  Nose: Nose normal.  Mouth/Throat: Oropharynx is clear and moist. No oropharyngeal exudate.  Eyes: Pupils are equal, round, and reactive to light. Conjunctivae and EOM are normal. Right eye exhibits no discharge. Left eye exhibits no discharge. No scleral icterus.  Neck: Neck supple. No JVD present. No tracheal deviation present. No thyromegaly present.  Cardiovascular: Normal rate, regular rhythm, normal heart sounds and intact distal pulses.  Exam reveals no gallop and no friction rub.   No murmur heard. Pulmonary/Chest: Effort normal and breath sounds normal. No respiratory distress. He has no wheezes. He has no rales. He exhibits no tenderness.  Abdominal: Soft. Bowel sounds are normal. He exhibits no distension and no mass. There is no tenderness. There is no rebound and no guarding.  Genitourinary: Penis normal. No penile tenderness.  Musculoskeletal: Normal range of motion. He exhibits no edema or tenderness.  Lymphadenopathy:    He has no cervical adenopathy.  Neurological: He is alert and oriented to person, place, and time. He has normal reflexes. No cranial nerve deficit. He exhibits normal muscle tone. Coordination  normal.  Skin: Skin is warm and dry. No rash noted. He is not diaphoretic. No erythema. No pallor.  Psychiatric: He has a normal mood and affect. His behavior is normal. Judgment and thought content normal.          Assessment & Plan:  Well exam. We discussed diet and exercise. Get fasting labs.  Gershon CraneStephen Nikeya Maxim, MD

## 2016-12-29 ENCOUNTER — Encounter: Payer: Self-pay | Admitting: Family Medicine

## 2017-02-28 DIAGNOSIS — H6983 Other specified disorders of Eustachian tube, bilateral: Secondary | ICD-10-CM | POA: Diagnosis not present

## 2017-02-28 DIAGNOSIS — H906 Mixed conductive and sensorineural hearing loss, bilateral: Secondary | ICD-10-CM | POA: Diagnosis not present

## 2017-03-07 ENCOUNTER — Other Ambulatory Visit: Payer: Self-pay | Admitting: Otolaryngology

## 2017-03-07 DIAGNOSIS — H906 Mixed conductive and sensorineural hearing loss, bilateral: Secondary | ICD-10-CM

## 2017-03-07 DIAGNOSIS — H6983 Other specified disorders of Eustachian tube, bilateral: Secondary | ICD-10-CM

## 2017-03-10 ENCOUNTER — Ambulatory Visit
Admission: RE | Admit: 2017-03-10 | Discharge: 2017-03-10 | Disposition: A | Discharge status: Home / Self Care | Payer: 59 | Source: Ambulatory Visit | Attending: Otolaryngology | Admitting: Otolaryngology

## 2017-03-10 DIAGNOSIS — H6983 Other specified disorders of Eustachian tube, bilateral: Secondary | ICD-10-CM

## 2017-03-10 DIAGNOSIS — H9192 Unspecified hearing loss, left ear: Secondary | ICD-10-CM | POA: Diagnosis not present

## 2017-03-10 DIAGNOSIS — H906 Mixed conductive and sensorineural hearing loss, bilateral: Secondary | ICD-10-CM

## 2017-03-10 MED ORDER — IOPAMIDOL (ISOVUE-300) INJECTION 61%
75.0000 mL | Freq: Once | INTRAVENOUS | Status: AC | PRN
  Administered 2017-03-10: 75 mL via INTRAVENOUS

## 2017-03-11 HISTORY — PX: TYMPANOPLASTY W/ MASTOIDECTOMY: SUR1400

## 2017-03-14 DIAGNOSIS — H906 Mixed conductive and sensorineural hearing loss, bilateral: Secondary | ICD-10-CM | POA: Diagnosis not present

## 2017-03-14 DIAGNOSIS — H6983 Other specified disorders of Eustachian tube, bilateral: Secondary | ICD-10-CM | POA: Diagnosis not present

## 2017-03-28 DIAGNOSIS — H906 Mixed conductive and sensorineural hearing loss, bilateral: Secondary | ICD-10-CM | POA: Diagnosis not present

## 2017-03-28 DIAGNOSIS — H6983 Other specified disorders of Eustachian tube, bilateral: Secondary | ICD-10-CM | POA: Diagnosis not present

## 2017-03-28 DIAGNOSIS — H74312 Ankylosis of ear ossicles, left ear: Secondary | ICD-10-CM | POA: Diagnosis not present

## 2017-04-05 DIAGNOSIS — H906 Mixed conductive and sensorineural hearing loss, bilateral: Secondary | ICD-10-CM | POA: Diagnosis not present

## 2017-04-05 DIAGNOSIS — H6983 Other specified disorders of Eustachian tube, bilateral: Secondary | ICD-10-CM | POA: Diagnosis not present

## 2017-05-02 DIAGNOSIS — H906 Mixed conductive and sensorineural hearing loss, bilateral: Secondary | ICD-10-CM | POA: Diagnosis not present

## 2017-05-02 DIAGNOSIS — H7411 Adhesive right middle ear disease: Secondary | ICD-10-CM | POA: Diagnosis not present

## 2017-05-02 DIAGNOSIS — H6983 Other specified disorders of Eustachian tube, bilateral: Secondary | ICD-10-CM | POA: Diagnosis not present

## 2017-12-05 DIAGNOSIS — H7411 Adhesive right middle ear disease: Secondary | ICD-10-CM | POA: Diagnosis not present

## 2017-12-05 DIAGNOSIS — H906 Mixed conductive and sensorineural hearing loss, bilateral: Secondary | ICD-10-CM | POA: Diagnosis not present

## 2017-12-05 DIAGNOSIS — H6983 Other specified disorders of Eustachian tube, bilateral: Secondary | ICD-10-CM | POA: Diagnosis not present

## 2017-12-12 ENCOUNTER — Telehealth: Payer: Self-pay

## 2017-12-12 ENCOUNTER — Encounter: Payer: Self-pay | Admitting: Family Medicine

## 2017-12-12 ENCOUNTER — Ambulatory Visit (INDEPENDENT_AMBULATORY_CARE_PROVIDER_SITE_OTHER): Payer: 59 | Admitting: Family Medicine

## 2017-12-12 VITALS — BP 132/94 | HR 57 | Temp 98.3°F | Ht 69.5 in | Wt 197.6 lb

## 2017-12-12 DIAGNOSIS — Z0000 Encounter for general adult medical examination without abnormal findings: Secondary | ICD-10-CM

## 2017-12-12 LAB — BASIC METABOLIC PANEL
BUN: 14 mg/dL (ref 6–23)
CHLORIDE: 99 meq/L (ref 96–112)
CO2: 31 mEq/L (ref 19–32)
Calcium: 9.7 mg/dL (ref 8.4–10.5)
Creatinine, Ser: 1.16 mg/dL (ref 0.40–1.50)
GFR: 72.65 mL/min (ref >60.00)
Glucose, Bld: 98 mg/dL (ref 70–99)
POTASSIUM: 4.2 meq/L (ref 3.5–5.1)
Sodium: 139 mEq/L (ref 135–145)

## 2017-12-12 LAB — CBC WITH DIFFERENTIAL/PLATELET
Basophils Absolute: 0 10*3/uL (ref 0.0–0.1)
Basophils Relative: 0.3 % (ref 0.0–3.0)
EOS PCT: 0.8 % (ref 0.0–5.0)
Eosinophils Absolute: 0.1 10*3/uL (ref 0.0–0.7)
HCT: 43.2 % (ref 39.0–52.0)
Hemoglobin: 14.8 g/dL (ref 13.0–17.0)
LYMPHS ABS: 2.5 10*3/uL (ref 0.7–4.0)
Lymphocytes Relative: 29.1 % (ref 12.0–46.0)
MCHC: 34.3 g/dL (ref 30.0–36.0)
MCV: 89.1 fl (ref 78.0–100.0)
MONO ABS: 0.7 10*3/uL (ref 0.1–1.0)
Monocytes Relative: 8.4 % (ref 3.0–12.0)
NEUTROS PCT: 61.4 % (ref 43.0–77.0)
Neutro Abs: 5.2 10*3/uL (ref 1.4–7.7)
Platelets: 261 10*3/uL (ref 150.0–400.0)
RBC: 4.85 Mil/uL (ref 4.22–5.81)
RDW: 13 % (ref 11.5–15.5)
WBC: 8.5 10*3/uL (ref 4.0–10.5)

## 2017-12-12 LAB — POC URINALSYSI DIPSTICK (AUTOMATED)
Bilirubin, UA: NEGATIVE
Glucose, UA: NEGATIVE
KETONES UA: NEGATIVE
Leukocytes, UA: NEGATIVE
Nitrite, UA: NEGATIVE
PROTEIN UA: NEGATIVE
RBC UA: NEGATIVE
SPEC GRAV UA: 1.01 (ref 1.010–1.025)
Urobilinogen, UA: 0.2 E.U./dL
pH, UA: 6.5 (ref 5.0–8.0)

## 2017-12-12 LAB — HEPATIC FUNCTION PANEL
ALT: 14 U/L (ref 0–53)
AST: 11 U/L (ref 0–37)
Albumin: 4.6 g/dL (ref 3.5–5.2)
Alkaline Phosphatase: 37 U/L — ABNORMAL LOW (ref 39–117)
BILIRUBIN TOTAL: 0.6 mg/dL (ref 0.2–1.2)
Bilirubin, Direct: 0.1 mg/dL (ref 0.0–0.3)
Total Protein: 6.9 g/dL (ref 6.0–8.3)

## 2017-12-12 LAB — TSH: TSH: 2.52 u[IU]/mL (ref 0.35–4.50)

## 2017-12-12 LAB — LIPID PANEL
Cholesterol: 207 mg/dL — ABNORMAL HIGH (ref 0–200)
HDL: 57.5 mg/dL (ref >39.00)
LDL Cholesterol: 119 mg/dL — ABNORMAL HIGH (ref 0–99)
NONHDL: 149.71
TRIGLYCERIDES: 152 mg/dL — AB (ref 0.0–149.0)
Total CHOL/HDL Ratio: 4
VLDL: 30.4 mg/dL (ref 0.0–40.0)

## 2017-12-12 NOTE — Telephone Encounter (Signed)
CPR wellness form   Placed in Dr. Claris Che RED folder  Call pt once completed, fax, and scan copy into pt's chart.
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
## 2018-02-01 DIAGNOSIS — H6531 Chronic mucoid otitis media, right ear: Secondary | ICD-10-CM | POA: Diagnosis not present

## 2018-02-01 DIAGNOSIS — H6983 Other specified disorders of Eustachian tube, bilateral: Secondary | ICD-10-CM | POA: Diagnosis not present

## 2018-02-01 DIAGNOSIS — H906 Mixed conductive and sensorineural hearing loss, bilateral: Secondary | ICD-10-CM | POA: Diagnosis not present

## 2018-02-19 DIAGNOSIS — H906 Mixed conductive and sensorineural hearing loss, bilateral: Secondary | ICD-10-CM | POA: Diagnosis not present

## 2018-02-19 DIAGNOSIS — H6983 Other specified disorders of Eustachian tube, bilateral: Secondary | ICD-10-CM | POA: Diagnosis not present

## 2018-03-12 DIAGNOSIS — H906 Mixed conductive and sensorineural hearing loss, bilateral: Secondary | ICD-10-CM | POA: Diagnosis not present

## 2018-03-12 DIAGNOSIS — H9071 Mixed conductive and sensorineural hearing loss, unilateral, right ear, with unrestricted hearing on the contralateral side: Secondary | ICD-10-CM | POA: Diagnosis not present

## 2018-03-12 DIAGNOSIS — H6983 Other specified disorders of Eustachian tube, bilateral: Secondary | ICD-10-CM | POA: Diagnosis not present

## 2018-03-12 DIAGNOSIS — H74311 Ankylosis of ear ossicles, right ear: Secondary | ICD-10-CM | POA: Diagnosis not present

## 2018-03-13 MED FILL — CEFPROZIL 500 MG TABLET: 500 | 10 days supply | Qty: 20 | Fill #0

## 2018-03-13 MED FILL — MUPIROCIN 2% OINTMENT: 2 | 10 days supply | Qty: 22 | Fill #0

## 2018-03-13 MED FILL — HYDROCODON-APAP 5-325: 5-325 | 5 days supply | Qty: 20 | Fill #0

## 2018-03-20 DIAGNOSIS — H74311 Ankylosis of ear ossicles, right ear: Secondary | ICD-10-CM | POA: Diagnosis not present

## 2018-03-20 DIAGNOSIS — H906 Mixed conductive and sensorineural hearing loss, bilateral: Secondary | ICD-10-CM | POA: Diagnosis not present

## 2018-03-20 DIAGNOSIS — H6983 Other specified disorders of Eustachian tube, bilateral: Secondary | ICD-10-CM | POA: Diagnosis not present

## 2018-05-02 DIAGNOSIS — H6983 Other specified disorders of Eustachian tube, bilateral: Secondary | ICD-10-CM | POA: Diagnosis not present

## 2018-05-02 DIAGNOSIS — H906 Mixed conductive and sensorineural hearing loss, bilateral: Secondary | ICD-10-CM | POA: Diagnosis not present

## 2018-05-02 DIAGNOSIS — H74311 Ankylosis of ear ossicles, right ear: Secondary | ICD-10-CM | POA: Diagnosis not present

## 2018-10-19 ENCOUNTER — Ambulatory Visit: Payer: 59

## 2018-10-19 ENCOUNTER — Encounter: Payer: Self-pay | Admitting: Orthopaedic Surgery

## 2018-10-19 ENCOUNTER — Ambulatory Visit (INDEPENDENT_AMBULATORY_CARE_PROVIDER_SITE_OTHER): Payer: 59 | Admitting: Orthopaedic Surgery

## 2018-10-19 ENCOUNTER — Other Ambulatory Visit: Payer: Self-pay

## 2018-10-19 VITALS — Ht 70.0 in | Wt 200.0 lb

## 2018-10-19 DIAGNOSIS — M25511 Pain in right shoulder: Secondary | ICD-10-CM

## 2018-10-19 NOTE — Progress Notes (Signed)
Subjective: Patient is here for ultrasound-guided intra-articular right glenohumeral injection.    Objective:  Pain and decreased ROM with overhead and behind the back reach.  Procedure: Ultrasound-guided right glenohumeral injection: After sterile prep with Betadine, injected 8 cc 1% lidocaine without epinephrine and 40 mg methylprednisolone using a 22-gauge spinal needle, passing the needle through approach into the glenohumeral joint.  Injectate seen filling joint capsule.  Very good relief during anesthetic phase.  RTC 6 weeks.
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
## 2018-12-04 ENCOUNTER — Ambulatory Visit: Payer: 59 | Admitting: Orthopaedic Surgery

## 2019-03-29 ENCOUNTER — Encounter: Payer: 59 | Admitting: Family Medicine

## 2019-04-16 ENCOUNTER — Other Ambulatory Visit: Payer: Self-pay

## 2019-04-17 ENCOUNTER — Ambulatory Visit (INDEPENDENT_AMBULATORY_CARE_PROVIDER_SITE_OTHER): Payer: 59 | Admitting: Family Medicine

## 2019-04-17 ENCOUNTER — Encounter: Payer: Self-pay | Admitting: Family Medicine

## 2019-04-17 VITALS — BP 110/62 | HR 61 | Temp 97.2°F | Ht 70.0 in | Wt 203.6 lb

## 2019-04-17 DIAGNOSIS — Z0000 Encounter for general adult medical examination without abnormal findings: Secondary | ICD-10-CM | POA: Diagnosis not present

## 2019-04-17 LAB — BASIC METABOLIC PANEL
BUN: 15 mg/dL (ref 6–23)
CO2: 32 mEq/L (ref 19–32)
Calcium: 9.7 mg/dL (ref 8.4–10.5)
Chloride: 99 mEq/L (ref 96–112)
Creatinine, Ser: 1.04 mg/dL (ref 0.40–1.50)
GFR: 77.06 mL/min (ref >60.00)
Glucose, Bld: 95 mg/dL (ref 70–99)
Potassium: 4 mEq/L (ref 3.5–5.1)
Sodium: 138 mEq/L (ref 135–145)

## 2019-04-17 LAB — HEPATIC FUNCTION PANEL
ALT: 22 U/L (ref 0–53)
AST: 19 U/L (ref 0–37)
Albumin: 4.9 g/dL (ref 3.5–5.2)
Alkaline Phosphatase: 46 U/L (ref 39–117)
Bilirubin, Direct: 0.1 mg/dL (ref 0.0–0.3)
Total Bilirubin: 0.4 mg/dL (ref 0.2–1.2)
Total Protein: 7.4 g/dL (ref 6.0–8.3)

## 2019-04-17 LAB — LIPID PANEL
Cholesterol: 243 mg/dL — ABNORMAL HIGH (ref 0–200)
HDL: 47.2 mg/dL (ref >39.00)
LDL Cholesterol: 167 mg/dL — ABNORMAL HIGH (ref 0–99)
NonHDL: 195.98
Total CHOL/HDL Ratio: 5
Triglycerides: 143 mg/dL (ref 0.0–149.0)
VLDL: 28.6 mg/dL (ref 0.0–40.0)

## 2019-04-17 LAB — CBC WITH DIFFERENTIAL/PLATELET
Basophils Absolute: 0 10*3/uL (ref 0.0–0.1)
Basophils Relative: 0.3 % (ref 0.0–3.0)
Eosinophils Absolute: 0.1 10*3/uL (ref 0.0–0.7)
Eosinophils Relative: 1.7 % (ref 0.0–5.0)
HCT: 43.8 % (ref 39.0–52.0)
Hemoglobin: 14.9 g/dL (ref 13.0–17.0)
Lymphocytes Relative: 26.4 % (ref 12.0–46.0)
Lymphs Abs: 1.5 10*3/uL (ref 0.7–4.0)
MCHC: 33.9 g/dL (ref 30.0–36.0)
MCV: 89 fl (ref 78.0–100.0)
Monocytes Absolute: 0.5 10*3/uL (ref 0.1–1.0)
Monocytes Relative: 8.9 % (ref 3.0–12.0)
Neutro Abs: 3.7 10*3/uL (ref 1.4–7.7)
Neutrophils Relative %: 62.7 % (ref 43.0–77.0)
Platelets: 291 10*3/uL (ref 150.0–400.0)
RBC: 4.92 Mil/uL (ref 4.22–5.81)
RDW: 12.5 % (ref 11.5–15.5)
WBC: 5.8 10*3/uL (ref 4.0–10.5)

## 2019-04-17 LAB — TSH: TSH: 1.35 u[IU]/mL (ref 0.35–4.50)

## 2019-04-17 NOTE — Progress Notes (Signed)
   Subjective:    Patient ID: Benjamin Morgan, male    DOB: Oct 10, 1975, 44 y.o.   MRN: [id number]  HPI Here for a well exam. He has no concerns. He is seeing Benjamin Morgan for psychotherapy for mild depression, and this is helping. They do not think he needs medications now. He is seeing Dr. Roda Morgan for right shoulder pain.   Review of Systems  Constitutional: Negative.   HENT: Negative.   Eyes: Negative.   Respiratory: Negative.   Cardiovascular: Negative.   Gastrointestinal: Negative.   Genitourinary: Negative.   Musculoskeletal: Positive for arthralgias.  Skin: Negative.   Neurological: Negative.   Psychiatric/Behavioral: Positive for dysphoric mood.       Objective:   Physical Exam Constitutional:      General: He is not in acute distress.    Appearance: He is well-developed. He is not diaphoretic.  HENT:     Head: Normocephalic and atraumatic.     Right Ear: External ear normal.     Left Ear: External ear normal.     Nose: Nose normal.     Mouth/Throat:     Pharynx: No oropharyngeal exudate.  Eyes:     General: No scleral icterus.       Right eye: No discharge.        Left eye: No discharge.     Conjunctiva/sclera: Conjunctivae normal.     Pupils: Pupils are equal, round, and reactive to light.  Neck:     Thyroid: No thyromegaly.     Vascular: No JVD.     Trachea: No tracheal deviation.  Cardiovascular:     Rate and Rhythm: Normal rate and regular rhythm.     Heart sounds: Normal heart sounds. No murmur. No friction rub. No gallop.   Pulmonary:     Effort: Pulmonary effort is normal. No respiratory distress.     Breath sounds: Normal breath sounds. No wheezing or rales.  Chest:     Chest wall: No tenderness.  Abdominal:     General: Bowel sounds are normal. There is no distension.     Palpations: Abdomen is soft. There is no mass.     Tenderness: There is no abdominal tenderness. There is no guarding or rebound.  Genitourinary:    Penis: Normal. No  tenderness.      Testes: Normal.  Musculoskeletal:        General: No tenderness. Normal range of motion.     Cervical back: Neck supple.  Lymphadenopathy:     Cervical: No cervical adenopathy.  Skin:    General: Skin is warm and dry.     Coloration: Skin is not pale.     Findings: No erythema or rash.  Neurological:     Mental Status: He is alert and oriented to person, place, and time.     Cranial Nerves: No cranial nerve deficit.     Motor: No abnormal muscle tone.     Coordination: Coordination normal.     Deep Tendon Reflexes: Reflexes are normal and symmetric. Reflexes normal.  Psychiatric:        Behavior: Behavior normal.        Thought Content: Thought content normal.        Judgment: Judgment normal.           Assessment & Plan:  Well exam. We discussed diet and exercise. Get fasting labs. Gershon Crane, MD

## 2019-04-24 ENCOUNTER — Encounter: Payer: Self-pay | Admitting: Family Medicine

## 2019-04-24 DIAGNOSIS — Z0000 Encounter for general adult medical examination without abnormal findings: Secondary | ICD-10-CM

## 2019-04-25 NOTE — Telephone Encounter (Signed)
I ordered the A1c. He can make a lab appt

## 2019-06-12 ENCOUNTER — Other Ambulatory Visit: Payer: Self-pay

## 2019-06-13 ENCOUNTER — Other Ambulatory Visit (INDEPENDENT_AMBULATORY_CARE_PROVIDER_SITE_OTHER): Payer: 59

## 2019-06-13 DIAGNOSIS — Z0000 Encounter for general adult medical examination without abnormal findings: Secondary | ICD-10-CM | POA: Diagnosis not present

## 2019-06-13 LAB — HEMOGLOBIN A1C: Hgb A1c MFr Bld: 5.8 % (ref 4.6–6.5)

## 2019-06-14 ENCOUNTER — Encounter: Payer: Self-pay | Admitting: Family Medicine

## 2019-09-11 ENCOUNTER — Encounter: Payer: Self-pay | Admitting: Family Medicine

## 2019-09-12 MED ORDER — TRIAMCINOLONE ACETONIDE 0.1 % EX CREA
1.0000 | TOPICAL_CREAM | Freq: Two times a day (BID) | CUTANEOUS | 1 refills | Status: DC
Start: 2019-09-12 — End: 2020-11-26

## 2019-09-12 NOTE — Telephone Encounter (Signed)
Call in Triamcinolone 0.1% cream to apply TID as needed, 30 grams with one rf

## 2019-09-16 NOTE — Telephone Encounter (Signed)
Noted  

## 2020-11-26 ENCOUNTER — Encounter: Payer: Self-pay | Admitting: Family Medicine

## 2020-11-26 ENCOUNTER — Other Ambulatory Visit: Payer: Self-pay

## 2020-11-26 MED ORDER — TRIAMCINOLONE ACETONIDE 0.1 % EX CREA: 1.0000 "application " | TOPICAL_CREAM | Freq: Two times a day (BID) | CUTANEOUS | 1 refills | Status: DC

## 2020-12-31 ENCOUNTER — Other Ambulatory Visit: Payer: Self-pay

## 2021-01-01 ENCOUNTER — Ambulatory Visit (INDEPENDENT_AMBULATORY_CARE_PROVIDER_SITE_OTHER): Payer: 59 | Admitting: Family Medicine

## 2021-01-01 ENCOUNTER — Encounter: Payer: Self-pay | Admitting: Family Medicine

## 2021-01-01 VITALS — BP 110/82 | HR 56 | Temp 98.7°F | Ht 70.0 in | Wt 202.0 lb

## 2021-01-01 DIAGNOSIS — Z0000 Encounter for general adult medical examination without abnormal findings: Secondary | ICD-10-CM | POA: Diagnosis not present

## 2021-01-01 LAB — BASIC METABOLIC PANEL
BUN: 12 mg/dL (ref 6–23)
CO2: 29 mEq/L (ref 19–32)
Calcium: 9.7 mg/dL (ref 8.4–10.5)
Chloride: 102 mEq/L (ref 96–112)
Creatinine, Ser: 1.14 mg/dL (ref 0.40–1.50)
GFR: 76.75 mL/min (ref >60.00)
Glucose, Bld: 89 mg/dL (ref 70–99)
Potassium: 4.4 mEq/L (ref 3.5–5.1)
Sodium: 140 mEq/L (ref 135–145)

## 2021-01-01 LAB — CBC WITH DIFFERENTIAL/PLATELET
Basophils Absolute: 0 10*3/uL (ref 0.0–0.1)
Basophils Relative: 0.7 % (ref 0.0–3.0)
Eosinophils Absolute: 0.1 10*3/uL (ref 0.0–0.7)
Eosinophils Relative: 1.3 % (ref 0.0–5.0)
HCT: 43.7 % (ref 39.0–52.0)
Hemoglobin: 14.6 g/dL (ref 13.0–17.0)
Lymphocytes Relative: 27.1 % (ref 12.0–46.0)
Lymphs Abs: 1.3 10*3/uL (ref 0.7–4.0)
MCHC: 33.4 g/dL (ref 30.0–36.0)
MCV: 89.3 fl (ref 78.0–100.0)
Monocytes Absolute: 0.5 10*3/uL (ref 0.1–1.0)
Monocytes Relative: 10.3 % (ref 3.0–12.0)
Neutro Abs: 3 10*3/uL (ref 1.4–7.7)
Neutrophils Relative %: 60.6 % (ref 43.0–77.0)
Platelets: 270 10*3/uL (ref 150.0–400.0)
RBC: 4.9 Mil/uL (ref 4.22–5.81)
RDW: 12.7 % (ref 11.5–15.5)
WBC: 4.9 10*3/uL (ref 4.0–10.5)

## 2021-01-01 LAB — LIPID PANEL
Cholesterol: 198 mg/dL (ref 0–200)
HDL: 46.4 mg/dL (ref >39.00)
LDL Cholesterol: 133 mg/dL — ABNORMAL HIGH (ref 0–99)
NonHDL: 151.49
Total CHOL/HDL Ratio: 4
Triglycerides: 94 mg/dL (ref 0.0–149.0)
VLDL: 18.8 mg/dL (ref 0.0–40.0)

## 2021-01-01 LAB — HEPATIC FUNCTION PANEL
ALT: 17 U/L (ref 0–53)
AST: 18 U/L (ref 0–37)
Albumin: 4.8 g/dL (ref 3.5–5.2)
Alkaline Phosphatase: 45 U/L (ref 39–117)
Bilirubin, Direct: 0.1 mg/dL (ref 0.0–0.3)
Total Bilirubin: 0.6 mg/dL (ref 0.2–1.2)
Total Protein: 7.4 g/dL (ref 6.0–8.3)

## 2021-01-01 LAB — TSH: TSH: 1.12 u[IU]/mL (ref 0.35–5.50)

## 2021-01-01 LAB — HEMOGLOBIN A1C: Hgb A1c MFr Bld: 6 % (ref 4.6–6.5)

## 2021-01-01 LAB — PSA: PSA: 0.92 ng/mL (ref 0.10–4.00)

## 2021-01-01 NOTE — Addendum Note (Signed)
Addended by: Kandra Nicolas on: 01/01/2021 01:37 PM   Modules accepted: Orders

## 2021-01-01 NOTE — Progress Notes (Signed)
   Subjective:    Patient ID: Benjamin Morgan, male    DOB: 1976-02-12, 45 y.o.   MRN: [id number]  HPI Here for a well exam. He feels fine.    Review of Systems  Constitutional: Negative.   HENT: Negative.    Eyes: Negative.   Respiratory: Negative.    Cardiovascular: Negative.   Gastrointestinal: Negative.   Genitourinary: Negative.   Musculoskeletal: Negative.   Skin: Negative.   Neurological: Negative.   Psychiatric/Behavioral: Negative.        Objective:   Physical Exam Constitutional:      General: He is not in acute distress.    Appearance: Normal appearance. He is well-developed. He is not diaphoretic.  HENT:     Head: Normocephalic and atraumatic.     Right Ear: External ear normal.     Left Ear: External ear normal.     Nose: Nose normal.     Mouth/Throat:     Pharynx: No oropharyngeal exudate.  Eyes:     General: No scleral icterus.       Right eye: No discharge.        Left eye: No discharge.     Conjunctiva/sclera: Conjunctivae normal.     Pupils: Pupils are equal, round, and reactive to light.  Neck:     Thyroid: No thyromegaly.     Vascular: No JVD.     Trachea: No tracheal deviation.  Cardiovascular:     Rate and Rhythm: Normal rate and regular rhythm.     Heart sounds: Normal heart sounds. No murmur heard.   No friction rub. No gallop.  Pulmonary:     Effort: Pulmonary effort is normal. No respiratory distress.     Breath sounds: Normal breath sounds. No wheezing or rales.  Chest:     Chest wall: No tenderness.  Abdominal:     General: Bowel sounds are normal. There is no distension.     Palpations: Abdomen is soft. There is no mass.     Tenderness: There is no abdominal tenderness. There is no guarding or rebound.  Genitourinary:    Penis: Normal. No tenderness.      Testes: Normal.     Prostate: Normal.     Rectum: Normal. Guaiac result negative.  Musculoskeletal:        General: No tenderness. Normal range of motion.     Cervical  back: Neck supple.  Lymphadenopathy:     Cervical: No cervical adenopathy.  Skin:    General: Skin is warm and dry.     Coloration: Skin is not pale.     Findings: No erythema or rash.  Neurological:     Mental Status: He is alert and oriented to person, place, and time.     Cranial Nerves: No cranial nerve deficit.     Motor: No abnormal muscle tone.     Coordination: Coordination normal.     Deep Tendon Reflexes: Reflexes are normal and symmetric. Reflexes normal.  Psychiatric:        Behavior: Behavior normal.        Thought Content: Thought content normal.        Judgment: Judgment normal.          Assessment & Plan:  Well exam. We discussed diet and exercise. Get fasting labs. Gershon Crane, MD

## 2022-01-11 ENCOUNTER — Encounter: Payer: Self-pay | Admitting: Family Medicine

## 2022-01-11 ENCOUNTER — Ambulatory Visit (INDEPENDENT_AMBULATORY_CARE_PROVIDER_SITE_OTHER): Payer: 59 | Admitting: Family Medicine

## 2022-01-11 VITALS — BP 110/80 | HR 53 | Temp 98.2°F | Ht 69.5 in | Wt 203.0 lb

## 2022-01-11 DIAGNOSIS — Z0000 Encounter for general adult medical examination without abnormal findings: Secondary | ICD-10-CM | POA: Diagnosis not present

## 2022-01-11 LAB — LIPID PANEL
Cholesterol: 202 mg/dL — ABNORMAL HIGH (ref 0–200)
HDL: 43.1 mg/dL (ref >39.00)
LDL Cholesterol: 120 mg/dL — ABNORMAL HIGH (ref 0–99)
NonHDL: 158.65
Total CHOL/HDL Ratio: 5
Triglycerides: 195 mg/dL — ABNORMAL HIGH (ref 0.0–149.0)
VLDL: 39 mg/dL (ref 0.0–40.0)

## 2022-01-11 LAB — CBC WITH DIFFERENTIAL/PLATELET
Basophils Absolute: 0 10*3/uL (ref 0.0–0.1)
Basophils Relative: 0.4 % (ref 0.0–3.0)
Eosinophils Absolute: 0.1 10*3/uL (ref 0.0–0.7)
Eosinophils Relative: 1.8 % (ref 0.0–5.0)
HCT: 42.8 % (ref 39.0–52.0)
Hemoglobin: 14.6 g/dL (ref 13.0–17.0)
Lymphocytes Relative: 21.7 % (ref 12.0–46.0)
Lymphs Abs: 1.3 10*3/uL (ref 0.7–4.0)
MCHC: 34.1 g/dL (ref 30.0–36.0)
MCV: 88.7 fl (ref 78.0–100.0)
Monocytes Absolute: 0.5 10*3/uL (ref 0.1–1.0)
Monocytes Relative: 9.2 % (ref 3.0–12.0)
Neutro Abs: 3.9 10*3/uL (ref 1.4–7.7)
Neutrophils Relative %: 66.9 % (ref 43.0–77.0)
Platelets: 270 10*3/uL (ref 150.0–400.0)
RBC: 4.82 Mil/uL (ref 4.22–5.81)
RDW: 13.1 % (ref 11.5–15.5)
WBC: 5.8 10*3/uL (ref 4.0–10.5)

## 2022-01-11 LAB — HEPATIC FUNCTION PANEL
ALT: 16 U/L (ref 0–53)
AST: 16 U/L (ref 0–37)
Albumin: 4.6 g/dL (ref 3.5–5.2)
Alkaline Phosphatase: 45 U/L (ref 39–117)
Bilirubin, Direct: 0.1 mg/dL (ref 0.0–0.3)
Total Bilirubin: 0.4 mg/dL (ref 0.2–1.2)
Total Protein: 7.1 g/dL (ref 6.0–8.3)

## 2022-01-11 LAB — HEMOGLOBIN A1C: Hgb A1c MFr Bld: 6.1 % (ref 4.6–6.5)

## 2022-01-11 LAB — BASIC METABOLIC PANEL
BUN: 11 mg/dL (ref 6–23)
CO2: 29 mEq/L (ref 19–32)
Calcium: 9.5 mg/dL (ref 8.4–10.5)
Chloride: 101 mEq/L (ref 96–112)
Creatinine, Ser: 1.15 mg/dL (ref 0.40–1.50)
GFR: 75.41 mL/min (ref >60.00)
Glucose, Bld: 100 mg/dL — ABNORMAL HIGH (ref 70–99)
Potassium: 4.6 mEq/L (ref 3.5–5.1)
Sodium: 139 mEq/L (ref 135–145)

## 2022-01-11 LAB — PSA: PSA: 0.81 ng/mL (ref 0.10–4.00)

## 2022-01-11 LAB — TSH: TSH: 1.46 u[IU]/mL (ref 0.35–5.50)

## 2022-01-11 NOTE — Progress Notes (Signed)
   Subjective:    Patient ID: Benjamin Morgan, male    DOB: March 05, 1976, 46 y.o.   MRN: [id number]  HPI Here for a well exam. He feels great.    Review of Systems  Constitutional: Negative.   HENT: Negative.    Eyes: Negative.   Respiratory: Negative.    Cardiovascular: Negative.   Gastrointestinal: Negative.   Genitourinary: Negative.   Musculoskeletal: Negative.   Skin: Negative.   Neurological: Negative.   Psychiatric/Behavioral: Negative.         Objective:   Physical Exam Constitutional:      General: He is not in acute distress.    Appearance: Normal appearance. He is well-developed. He is not diaphoretic.  HENT:     Head: Normocephalic and atraumatic.     Right Ear: External ear normal.     Left Ear: External ear normal.     Nose: Nose normal.     Mouth/Throat:     Pharynx: No oropharyngeal exudate.  Eyes:     General: No scleral icterus.       Right eye: No discharge.        Left eye: No discharge.     Conjunctiva/sclera: Conjunctivae normal.     Pupils: Pupils are equal, round, and reactive to light.  Neck:     Thyroid: No thyromegaly.     Vascular: No JVD.     Trachea: No tracheal deviation.  Cardiovascular:     Rate and Rhythm: Normal rate and regular rhythm.     Heart sounds: Normal heart sounds. No murmur heard.    No friction rub. No gallop.  Pulmonary:     Effort: Pulmonary effort is normal. No respiratory distress.     Breath sounds: Normal breath sounds. No wheezing or rales.  Chest:     Chest wall: No tenderness.  Abdominal:     General: Bowel sounds are normal. There is no distension.     Palpations: Abdomen is soft. There is no mass.     Tenderness: There is no abdominal tenderness. There is no guarding or rebound.  Genitourinary:    Penis: Normal. No tenderness.      Testes: Normal.     Prostate: Normal.     Rectum: Normal. Guaiac result negative.  Musculoskeletal:        General: No tenderness. Normal range of motion.     Cervical  back: Neck supple.  Lymphadenopathy:     Cervical: No cervical adenopathy.  Skin:    General: Skin is warm and dry.     Coloration: Skin is not pale.     Findings: No erythema or rash.  Neurological:     Mental Status: He is alert and oriented to person, place, and time.     Cranial Nerves: No cranial nerve deficit.     Motor: No abnormal muscle tone.     Coordination: Coordination normal.     Deep Tendon Reflexes: Reflexes are normal and symmetric. Reflexes normal.  Psychiatric:        Behavior: Behavior normal.        Thought Content: Thought content normal.        Judgment: Judgment normal.           Assessment & Plan:  Well exam. We discussed diet and exercise. Get fasting labs. Alysia Penna, MD

## 2022-01-12 NOTE — Telephone Encounter (Signed)
The form is ready  

## 2022-01-31 NOTE — Telephone Encounter (Signed)
Pt Physician form was faxed to [facility] and copy of the form was sent to scanning

## 2022-12-28 ENCOUNTER — Ambulatory Visit (INDEPENDENT_AMBULATORY_CARE_PROVIDER_SITE_OTHER): Payer: 59 | Admitting: Orthopaedic Surgery

## 2022-12-28 ENCOUNTER — Other Ambulatory Visit (INDEPENDENT_AMBULATORY_CARE_PROVIDER_SITE_OTHER): Payer: 59

## 2022-12-28 DIAGNOSIS — M25571 Pain in right ankle and joints of right foot: Secondary | ICD-10-CM
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
## 2023-01-12 ENCOUNTER — Ambulatory Visit: Payer: 59 | Admitting: Rehabilitative and Restorative Service Providers"

## 2023-01-12 ENCOUNTER — Encounter: Payer: Self-pay | Admitting: Rehabilitative and Restorative Service Providers"

## 2023-01-12 ENCOUNTER — Other Ambulatory Visit: Payer: Self-pay

## 2023-01-12 DIAGNOSIS — R262 Difficulty in walking, not elsewhere classified: Secondary | ICD-10-CM

## 2023-01-12 DIAGNOSIS — M6281 Muscle weakness (generalized): Secondary | ICD-10-CM

## 2023-01-12 DIAGNOSIS — M25571 Pain in right ankle and joints of right foot: Secondary | ICD-10-CM

## 2023-01-13 ENCOUNTER — Ambulatory Visit (INDEPENDENT_AMBULATORY_CARE_PROVIDER_SITE_OTHER): Payer: 59 | Admitting: Family Medicine

## 2023-01-13 ENCOUNTER — Encounter: Payer: Self-pay | Admitting: Family Medicine

## 2023-01-13 ENCOUNTER — Other Ambulatory Visit (INDEPENDENT_AMBULATORY_CARE_PROVIDER_SITE_OTHER): Payer: 59

## 2023-01-13 VITALS — BP 128/84 | HR 54 | Temp 97.7°F | Ht 70.25 in | Wt 209.0 lb

## 2023-01-13 DIAGNOSIS — Z0000 Encounter for general adult medical examination without abnormal findings: Secondary | ICD-10-CM | POA: Diagnosis not present

## 2023-01-13 DIAGNOSIS — Z23 Encounter for immunization: Secondary | ICD-10-CM

## 2023-01-13 LAB — BASIC METABOLIC PANEL
BUN: 12 mg/dL (ref 6–23)
CO2: 30 meq/L (ref 19–32)
Calcium: 9.9 mg/dL (ref 8.4–10.5)
Chloride: 101 meq/L (ref 96–112)
Creatinine, Ser: 1.1 mg/dL (ref 0.40–1.50)
GFR: 78.98 mL/min (ref >60.00)
Glucose, Bld: 106 mg/dL — ABNORMAL HIGH (ref 70–99)
Potassium: 4.6 meq/L (ref 3.5–5.1)
Sodium: 140 meq/L (ref 135–145)

## 2023-01-13 LAB — LIPID PANEL
Cholesterol: 216 mg/dL — ABNORMAL HIGH (ref 0–200)
HDL: 51.7 mg/dL (ref >39.00)
LDL Cholesterol: 136 mg/dL — ABNORMAL HIGH (ref 0–99)
NonHDL: 164.42
Total CHOL/HDL Ratio: 4
Triglycerides: 142 mg/dL (ref 0.0–149.0)
VLDL: 28.4 mg/dL (ref 0.0–40.0)

## 2023-01-13 LAB — CBC WITH DIFFERENTIAL/PLATELET
Basophils Absolute: 0 10*3/uL (ref 0.0–0.1)
Basophils Relative: 0.5 % (ref 0.0–3.0)
Eosinophils Absolute: 0.1 10*3/uL (ref 0.0–0.7)
Eosinophils Relative: 1.5 % (ref 0.0–5.0)
HCT: 44.2 % (ref 39.0–52.0)
Hemoglobin: 14.5 g/dL (ref 13.0–17.0)
Lymphocytes Relative: 24 % (ref 12.0–46.0)
Lymphs Abs: 1.1 10*3/uL (ref 0.7–4.0)
MCHC: 32.9 g/dL (ref 30.0–36.0)
MCV: 90.4 fL (ref 78.0–100.0)
Monocytes Absolute: 0.5 10*3/uL (ref 0.1–1.0)
Monocytes Relative: 9.8 % (ref 3.0–12.0)
Neutro Abs: 3 10*3/uL (ref 1.4–7.7)
Neutrophils Relative %: 64.2 % (ref 43.0–77.0)
Platelets: 300 10*3/uL (ref 150.0–400.0)
RBC: 4.89 Mil/uL (ref 4.22–5.81)
RDW: 13 % (ref 11.5–15.5)
WBC: 4.7 10*3/uL (ref 4.0–10.5)

## 2023-01-13 LAB — HEPATIC FUNCTION PANEL
ALT: 17 U/L (ref 0–53)
AST: 16 U/L (ref 0–37)
Albumin: 4.7 g/dL (ref 3.5–5.2)
Alkaline Phosphatase: 49 U/L (ref 39–117)
Bilirubin, Direct: 0.1 mg/dL (ref 0.0–0.3)
Total Bilirubin: 0.4 mg/dL (ref 0.2–1.2)
Total Protein: 7.4 g/dL (ref 6.0–8.3)

## 2023-01-13 LAB — PSA: PSA: 1.08 ng/mL (ref 0.10–4.00)

## 2023-01-13 LAB — TSH: TSH: 1.24 u[IU]/mL (ref 0.35–5.50)

## 2023-01-13 LAB — HEMOGLOBIN A1C: Hgb A1c MFr Bld: 5.9 % (ref 4.6–6.5)
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
## 2023-01-13 NOTE — Addendum Note (Signed)
Addended by: Carola Rhine on: 01/13/2023 11:30 AM   Modules accepted: Orders

## 2023-01-18 ENCOUNTER — Encounter: Payer: Self-pay | Admitting: Family Medicine

## 2023-01-18 NOTE — Telephone Encounter (Signed)
Pt form printed and placed on Dr Clent Ridges red folder for signiture

## 2023-01-24 NOTE — Telephone Encounter (Signed)
Pt form was completed and faxed to the fax number provided

## 2024-01-15 ENCOUNTER — Encounter: Payer: Self-pay | Admitting: Family Medicine

## 2024-01-15 ENCOUNTER — Ambulatory Visit (INDEPENDENT_AMBULATORY_CARE_PROVIDER_SITE_OTHER): Admitting: Family Medicine

## 2024-01-15 VITALS — BP 118/78 | HR 52 | Temp 97.9°F | Wt 200.0 lb

## 2024-01-15 DIAGNOSIS — Z0000 Encounter for general adult medical examination without abnormal findings: Secondary | ICD-10-CM | POA: Diagnosis not present

## 2024-01-15 LAB — CBC WITH DIFFERENTIAL/PLATELET
Basophils Absolute: 0 K/uL (ref 0.0–0.1)
Basophils Relative: 0.6 % (ref 0.0–3.0)
Eosinophils Absolute: 0.1 K/uL (ref 0.0–0.7)
Eosinophils Relative: 1.6 % (ref 0.0–5.0)
HCT: 43.8 % (ref 39.0–52.0)
Hemoglobin: 14.5 g/dL (ref 13.0–17.0)
Lymphocytes Relative: 28.5 % (ref 12.0–46.0)
Lymphs Abs: 1.2 K/uL (ref 0.7–4.0)
MCHC: 33 g/dL (ref 30.0–36.0)
MCV: 90.2 fl (ref 78.0–100.0)
Monocytes Absolute: 0.4 K/uL (ref 0.1–1.0)
Monocytes Relative: 9.9 % (ref 3.0–12.0)
Neutro Abs: 2.5 K/uL (ref 1.4–7.7)
Neutrophils Relative %: 59.4 % (ref 43.0–77.0)
Platelets: 273 K/uL (ref 150.0–400.0)
RBC: 4.86 Mil/uL (ref 4.22–5.81)
RDW: 12.9 % (ref 11.5–15.5)
WBC: 4.3 K/uL (ref 4.0–10.5)

## 2024-01-15 LAB — HEPATIC FUNCTION PANEL
ALT: 17 U/L (ref 0–53)
AST: 16 U/L (ref 0–37)
Albumin: 4.9 g/dL (ref 3.5–5.2)
Alkaline Phosphatase: 46 U/L (ref 39–117)
Bilirubin, Direct: 0.1 mg/dL (ref 0.0–0.3)
Total Bilirubin: 0.4 mg/dL (ref 0.2–1.2)
Total Protein: 7.3 g/dL (ref 6.0–8.3)

## 2024-01-15 LAB — TSH: TSH: 1.19 u[IU]/mL (ref 0.35–5.50)

## 2024-01-15 LAB — LIPID PANEL
Cholesterol: 217 mg/dL — ABNORMAL HIGH (ref 0–200)
HDL: 56.4 mg/dL (ref >39.00)
LDL Cholesterol: 135 mg/dL — ABNORMAL HIGH (ref 0–99)
NonHDL: 160.28
Total CHOL/HDL Ratio: 4
Triglycerides: 124 mg/dL (ref 0.0–149.0)
VLDL: 24.8 mg/dL (ref 0.0–40.0)

## 2024-01-15 LAB — HEMOGLOBIN A1C: Hgb A1c MFr Bld: 6 % (ref 4.6–6.5)

## 2024-01-15 LAB — BASIC METABOLIC PANEL WITH GFR
BUN: 10 mg/dL (ref 6–23)
CO2: 28 meq/L (ref 19–32)
Calcium: 10 mg/dL (ref 8.4–10.5)
Chloride: 102 meq/L (ref 96–112)
Creatinine, Ser: 1.06 mg/dL (ref 0.40–1.50)
GFR: 81.99 mL/min (ref >60.00)
Glucose, Bld: 107 mg/dL — ABNORMAL HIGH (ref 70–99)
Potassium: 4.6 meq/L (ref 3.5–5.1)
Sodium: 142 meq/L (ref 135–145)

## 2024-01-15 LAB — PSA: PSA: 1.05 ng/mL (ref 0.10–4.00)

## 2024-01-15 NOTE — Progress Notes (Signed)
 Subjective:    Patient ID: Benjamin Morgan, male    DOB: [birth year], 48 y.o.   MRN: [id number]  HPI Here for a well exam. He feels well.    Review of Systems  Constitutional: Negative.   HENT: Negative.    Eyes: Negative.   Respiratory: Negative.    Cardiovascular: Negative.   Gastrointestinal: Negative.   Genitourinary: Negative.   Musculoskeletal: Negative.   Skin: Negative.   Neurological: Negative.   Psychiatric/Behavioral: Negative.         Objective:   Physical Exam Constitutional:      General: He is not in acute distress.    Appearance: Normal appearance. He is well-developed. He is not diaphoretic.  HENT:     Head: Normocephalic and atraumatic.     Right Ear: External ear normal.     Left Ear: External ear normal.     Nose: Nose normal.     Mouth/Throat:     Pharynx: No oropharyngeal exudate.  Eyes:     General: No scleral icterus.       Right eye: No discharge.        Left eye: No discharge.     Conjunctiva/sclera: Conjunctivae normal.     Pupils: Pupils are equal, round, and reactive to light.  Neck:     Thyroid : No thyromegaly.     Vascular: No JVD.     Trachea: No tracheal deviation.  Cardiovascular:     Rate and Rhythm: Normal rate and regular rhythm.     Pulses: Normal pulses.     Heart sounds: Normal heart sounds. No murmur heard.    No friction rub. No gallop.  Pulmonary:     Effort: Pulmonary effort is normal. No respiratory distress.     Breath sounds: Normal breath sounds. No wheezing or rales.  Chest:     Chest wall: No tenderness.  Abdominal:     General: Bowel sounds are normal. There is no distension.     Palpations: Abdomen is soft. There is no mass.     Tenderness: There is no abdominal tenderness. There is no guarding or rebound.  Genitourinary:    Penis: Normal. No tenderness.      Testes: Normal.     Prostate: Normal.     Rectum: Normal. Guaiac result negative.  Musculoskeletal:        General: No tenderness. Normal  range of motion.     Cervical back: Neck supple.  Lymphadenopathy:     Cervical: No cervical adenopathy.  Skin:    General: Skin is warm and dry.     Coloration: Skin is not pale.     Findings: No erythema or rash.  Neurological:     General: No focal deficit present.     Mental Status: He is alert and oriented to person, place, and time.     Cranial Nerves: No cranial nerve deficit.     Motor: No abnormal muscle tone.     Coordination: Coordination normal.     Deep Tendon Reflexes: Reflexes are normal and symmetric. Reflexes normal.  Psychiatric:        Mood and Affect: Mood normal.        Behavior: Behavior normal.        Thought Content: Thought content normal.        Judgment: Judgment normal.           Assessment & Plan:  Well exam. We discussed diet and exercise. Get fasting labs. Set up  a colonoscopy.  Garnette Olmsted, MD

## 2024-01-16 ENCOUNTER — Ambulatory Visit: Payer: Self-pay | Admitting: Family Medicine

## 2024-04-30 ENCOUNTER — Encounter: Payer: Self-pay | Admitting: Gastroenterology

## 2024-05-20 ENCOUNTER — Encounter

## 2024-06-03 ENCOUNTER — Encounter: Admitting: Gastroenterology
